# Patient Record
Sex: Female | Born: 2000 | Race: Black or African American | Hispanic: No | Marital: Single | Smoking: Never smoker
Health system: Southern US, Community
[De-identification: ages and names within clinical notes are randomized; demographics above are authoritative.]

---
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
## 2022-04-14 IMAGING — DX DG CHEST 1V PORT
1 series · 1 of 1 positions shown · non-contrast
Comparison: None.

CLINICAL DATA: 19-year-old female with cough.

EXAM:
PORTABLE CHEST 1 VIEW

[chest ap]
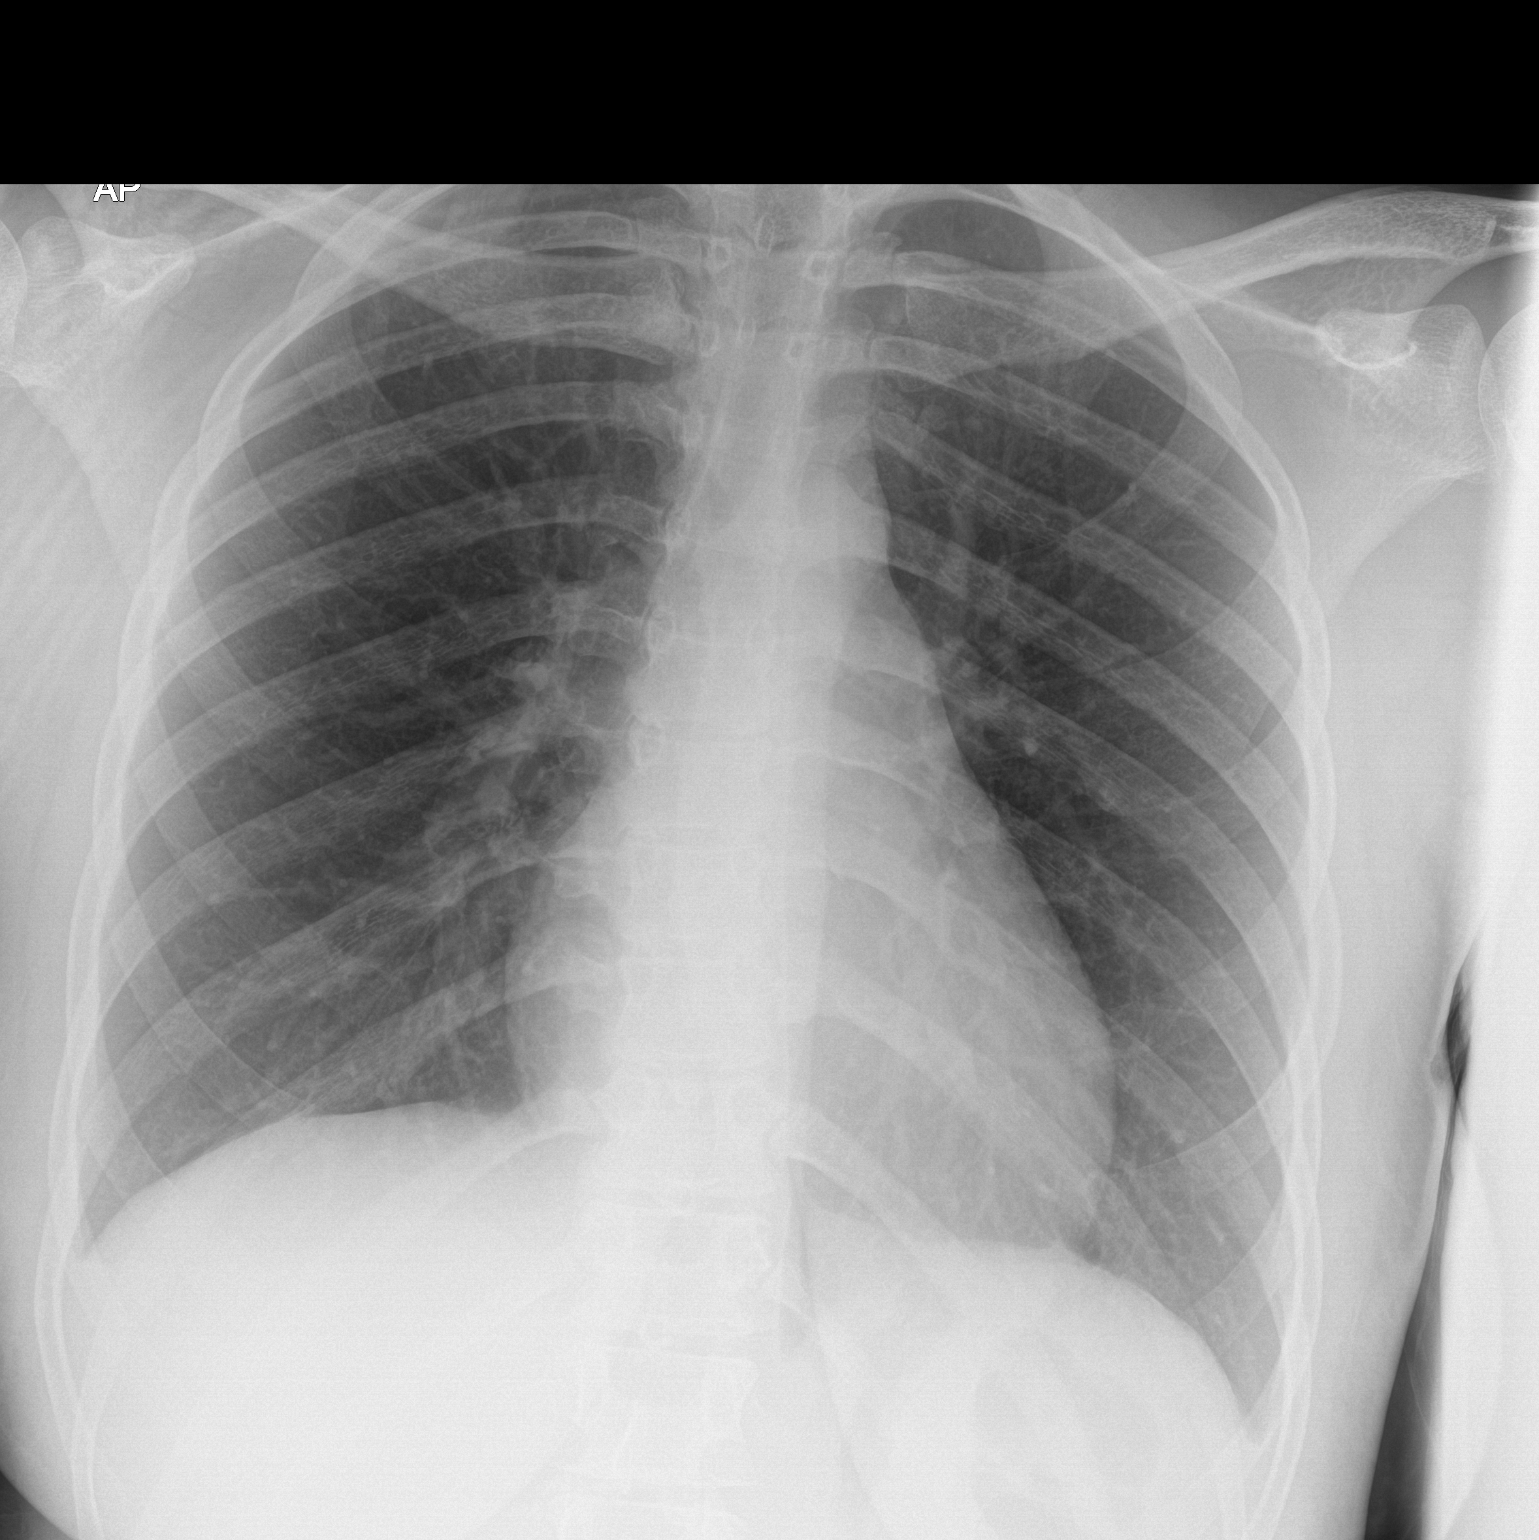

[1 of 1 positions shown; findings below may reference images not displayed]

FINDINGS: The heart size and mediastinal contours are within normal limits.
Both lungs are clear. The visualized skeletal structures are
unremarkable.
IMPRESSION: No active disease.
